# Patient Record
Sex: Male | Born: 1965 | Race: White | Hispanic: No | Marital: Married | Smoking: Never smoker
Health system: Southern US, Community
[De-identification: ages and names within clinical notes are randomized; demographics above are authoritative.]

---

## 2020-11-30 ENCOUNTER — Other Ambulatory Visit: Payer: Self-pay

## 2020-11-30 ENCOUNTER — Emergency Department (HOSPITAL_BASED_OUTPATIENT_CLINIC_OR_DEPARTMENT_OTHER): Payer: No Typology Code available for payment source

## 2020-11-30 ENCOUNTER — Encounter (HOSPITAL_BASED_OUTPATIENT_CLINIC_OR_DEPARTMENT_OTHER): Payer: Self-pay | Admitting: *Deleted

## 2020-11-30 ENCOUNTER — Emergency Department (HOSPITAL_BASED_OUTPATIENT_CLINIC_OR_DEPARTMENT_OTHER)
Admission: EM | Admit: 2020-11-30 | Discharge: 2020-11-30 | Disposition: A | Discharge status: Home / Self Care | Payer: No Typology Code available for payment source | Attending: Emergency Medicine | Admitting: Emergency Medicine

## 2020-11-30 DIAGNOSIS — K5900 Constipation, unspecified: Secondary | ICD-10-CM | POA: Insufficient documentation

## 2020-11-30 DIAGNOSIS — R1013 Epigastric pain: Secondary | ICD-10-CM | POA: Diagnosis present

## 2020-11-30 DIAGNOSIS — R1010 Upper abdominal pain, unspecified: Secondary | ICD-10-CM | POA: Diagnosis not present

## 2020-11-30 DIAGNOSIS — R079 Chest pain, unspecified: Secondary | ICD-10-CM

## 2020-11-30 LAB — BASIC METABOLIC PANEL
Anion gap: 8 (ref 5–15)
BUN: 10 mg/dL (ref 6–20)
CO2: 25 mmol/L (ref 22–32)
Calcium: 8.8 mg/dL — ABNORMAL LOW (ref 8.9–10.3)
Chloride: 105 mmol/L (ref 98–111)
Creatinine, Ser: 0.76 mg/dL (ref 0.61–1.24)
GFR, Estimated: >60 mL/min (ref >60)
Glucose, Bld: 117 mg/dL — ABNORMAL HIGH (ref 70–99)
Potassium: 4 mmol/L (ref 3.5–5.1)
Sodium: 138 mmol/L (ref 135–145)

## 2020-11-30 LAB — CBC
HCT: 47.3 % (ref 39.0–52.0)
Hemoglobin: 15.4 g/dL (ref 13.0–17.0)
MCH: 29.3 pg (ref 26.0–34.0)
MCHC: 32.6 g/dL (ref 30.0–36.0)
MCV: 90.1 fL (ref 80.0–100.0)
Platelets: 218 10*3/uL (ref 150–400)
RBC: 5.25 MIL/uL (ref 4.22–5.81)
RDW: 13.1 % (ref 11.5–15.5)
WBC: 6.1 10*3/uL (ref 4.0–10.5)
nRBC: 0 % (ref 0.0–0.2)

## 2020-11-30 LAB — TROPONIN I (HIGH SENSITIVITY)
Troponin I (High Sensitivity): 2 ng/L (ref <18)
Troponin I (High Sensitivity): <2 ng/L (ref <18)

## 2020-11-30 LAB — LIPASE, BLOOD: Lipase: 30 U/L (ref 11–51)

## 2020-11-30 MED ORDER — ALUM & MAG HYDROXIDE-SIMETH 200-200-20 MG/5ML PO SUSP
30.0000 mL | Freq: Once | ORAL | Status: AC
  Administered 2020-11-30: 30 mL via ORAL
  Filled 2020-11-30: qty 30

## 2020-11-30 MED ORDER — LIDOCAINE VISCOUS HCL 2 % MT SOLN
15.0000 mL | Freq: Once | OROMUCOSAL | Status: AC
  Administered 2020-11-30: 15 mL via ORAL
  Filled 2020-11-30: qty 15

## 2020-11-30 NOTE — Discharge Instructions (Addendum)
Below is a referral to [facility] gastroenterology.  Please give them a call and schedule a follow-up appointment at your convenience.  If you develop any new or worsening symptoms, please come back to the emergency department.  It was a pleasure to meet you.

## 2020-11-30 NOTE — ED Triage Notes (Signed)
Epigastric pain off and on x 6 months. EKG done at triage. He has not taken any OTC meds.

## 2020-11-30 NOTE — ED Provider Notes (Signed)
[facility] EMERGENCY DEPARTMENT Provider Note   CSN: [id number] Arrival date & time: 11/30/20  1236     History Chief Complaint  Patient presents with   Chest Pain    Andres Montes is a 55 y.o. male.  HPI Patient is a 55 year old male who is followed at the [place] who presents to the emergency department due to chest pain.  Patient states that he has a history of IBS-C and was having upper abdominal cramping as well as retching yesterday.  After this occurred he then began experiencing right-sided intermittent aching chest pain.  Worsens with movement.  Will sometimes radiate down his right arm.  No shortness of breath or diaphoresis when this occurs.  No current nausea or vomiting.  He states that he was initially constipated but had a BM yesterday.  No other complaints.  No unilateral leg swelling, hormone use, recent travel, history of blood clots.    History reviewed. No pertinent past medical history.  There are no problems to display for this patient.   History reviewed. No pertinent surgical history.     No family history on file.  Social History   Tobacco Use   Smoking status: Never   Smokeless tobacco: Never  Vaping Use   Vaping Use: Never used  Substance Use Topics   Alcohol use: Never   Drug use: Never    Home Medications Prior to Admission medications   Not on File    Allergies    Penicillins  Review of Systems   Review of Systems  All other systems reviewed and are negative. Ten systems reviewed and are negative for acute change, except as noted in the HPI.   Physical Exam Updated Vital Signs BP 131/76   Pulse 73   Temp 98.7 F (37.1 C) (Oral)   Resp 17   Ht 5\' 10"  (1.778 m)   Wt 127 kg   SpO2 96%   BMI 40.18 kg/m   Physical Exam Vitals and nursing note reviewed.  Constitutional:      General: He is not in acute distress.    Appearance: Normal appearance. He is well-developed. He is not ill-appearing, toxic-appearing or  diaphoretic.  HENT:     Head: Normocephalic and atraumatic.     Right Ear: External ear normal.     Left Ear: External ear normal.     Nose: Nose normal.     Mouth/Throat:     Mouth: Mucous membranes are moist.     Pharynx: Oropharynx is clear. No oropharyngeal exudate or posterior oropharyngeal erythema.  Eyes:     Extraocular Movements: Extraocular movements intact.  Cardiovascular:     Rate and Rhythm: Normal rate and regular rhythm.     Pulses: Normal pulses.          Radial pulses are 2+ on the right side and 2+ on the left side.       Dorsalis pedis pulses are 2+ on the right side and 2+ on the left side.     Heart sounds: Normal heart sounds. Heart sounds not distant. No murmur heard. No systolic murmur is present.  No diastolic murmur is present.    No friction rub. No gallop.  Pulmonary:     Effort: Pulmonary effort is normal. No tachypnea, accessory muscle usage or respiratory distress.     Breath sounds: Normal breath sounds. No stridor. No decreased breath sounds, wheezing, rhonchi or rales.     Comments: Lungs are clear to auscultation bilaterally. Abdominal:  General: Abdomen is flat.     Palpations: Abdomen is soft.     Tenderness: There is abdominal tenderness.     Comments: Protuberant abdomen that is soft.  Mild tenderness noted overlying the epigastrium.  Musculoskeletal:        General: Normal range of motion.     Cervical back: Normal range of motion and neck supple. No tenderness.     Right lower leg: No tenderness. No edema.     Left lower leg: No tenderness. No edema.     Comments: No leg swelling or calf tenderness.  Skin:    General: Skin is warm and dry.  Neurological:     General: No focal deficit present.     Mental Status: He is alert and oriented to person, place, and time.  Psychiatric:        Mood and Affect: Mood normal.        Behavior: Behavior normal.   ED Results / Procedures / Treatments   Labs (all labs ordered are listed, but  only abnormal results are displayed) Labs Reviewed  BASIC METABOLIC PANEL - Abnormal; Notable for the following components:      Result Value   Glucose, Bld 117 (*)    Calcium 8.8 (*)    All other components within normal limits  CBC  LIPASE, BLOOD  TROPONIN I (HIGH SENSITIVITY)  TROPONIN I (HIGH SENSITIVITY)   EKG EKG Interpretation  Date/Time:  Monday November 30 2020 12:42:05 EDT Ventricular Rate:  87 PR Interval:  172 QRS Duration: 82 QT Interval:  350 QTC Calculation: 421 R Axis:   -54 Text Interpretation: Normal sinus rhythm Left anterior fascicular block Cannot rule out Inferior infarct (masked by fascicular block?) , age undetermined Abnormal ECG No previous ECGs available Confirmed by Frederick Peers [phone]) on 11/30/2020 1:05:06 PM  Radiology DG Chest 2 View  Result Date: 11/30/2020 CLINICAL DATA:  Epigastric pain. Additional history provided: Patient reports central chest pain for 3 days, digestive problems. Epigastric pain off and on for 6 months. EXAM: CHEST - 2 VIEW COMPARISON:  No pertinent prior exams available for comparison. FINDINGS: Heart size within normal limits. No appreciable airspace consolidation. No evidence of pleural effusion or pneumothorax. No acute bony abnormality identified. IMPRESSION: No evidence of active cardiopulmonary disease. Electronically Signed   By: Jackey Loge DO   On: 11/30/2020 13:01    Procedures Procedures   Medications Ordered in ED Medications  alum & mag hydroxide-simeth (MAALOX/MYLANTA) 200-200-20 MG/5ML suspension 30 mL (30 mLs Oral Given 11/30/20 1506)    And  lidocaine (XYLOCAINE) 2 % viscous mouth solution 15 mL (15 mLs Oral Given 11/30/20 1506)   ED Course  I have reviewed the triage vital signs and the nursing notes.  Pertinent labs & imaging results that were available during my care of the patient were reviewed by me and considered in my medical decision making (see chart for details).  Clinical Course as of 11/30/20  1615  Mon Nov 30, 2020  1439 Initial lab work reassuring.  No elevation in troponin.  Will repeat troponin.  Will additionally add lipase.  GI cocktail.  Will reassess. [LJ]    Clinical Course User Index [LJ] Placido Sou, PA-C   MDM Rules/Calculators/A&P                          Pt is a 55 y.o. male who presents to the ED with aching CP and epigastric pain.  Labs: CBC without abnormalities. BMP with a glucose of 117 and a calcium of 8.8. Lipase of 30. Troponin of 2 with a repeat less than 2.  Imaging: Chest x-ray is negative.  I, Placido Sou, PA-C, personally reviewed and evaluated these images and lab results as part of my medical decision-making.  Lab work today is reassuring.  No elevation in troponins.  Doubt ACS.  Chest x-ray is negative.  Given a GI cocktail with minimal relief.  Discussed findings today with patient as well as his wife who is a Designer, jewellery.  Feel that he is stable for discharge at this time and they are agreeable.  She requested a new referral to gastroenterology which was given to them.  They verbalized understanding of the above plan.  Given strict return precautions.  Their questions were answered and they were amicable at the time of discharge.  Note: Portions of this report may have been transcribed using voice recognition software. Every effort was made to ensure accuracy; however, inadvertent computerized transcription errors may be present.   Final Clinical Impression(s) / ED Diagnoses Final diagnoses:  Epigastric pain  Chest pain, unspecified type    Rx / DC Orders ED Discharge Orders     None        Placido Sou, PA-C 11/30/20 1617    Tilden Fossa, MD 11/30/20 [phone]

## 2022-06-24 IMAGING — CR DG CHEST 2V
2 series · 2 of 2 positions shown · non-contrast
Comparison: No pertinent prior exams available for comparison.

CLINICAL DATA: Epigastric pain. Additional history provided:
Epigastric pain off and on for 6 months.

EXAM:
CHEST - 2 VIEW

[w chest pa]
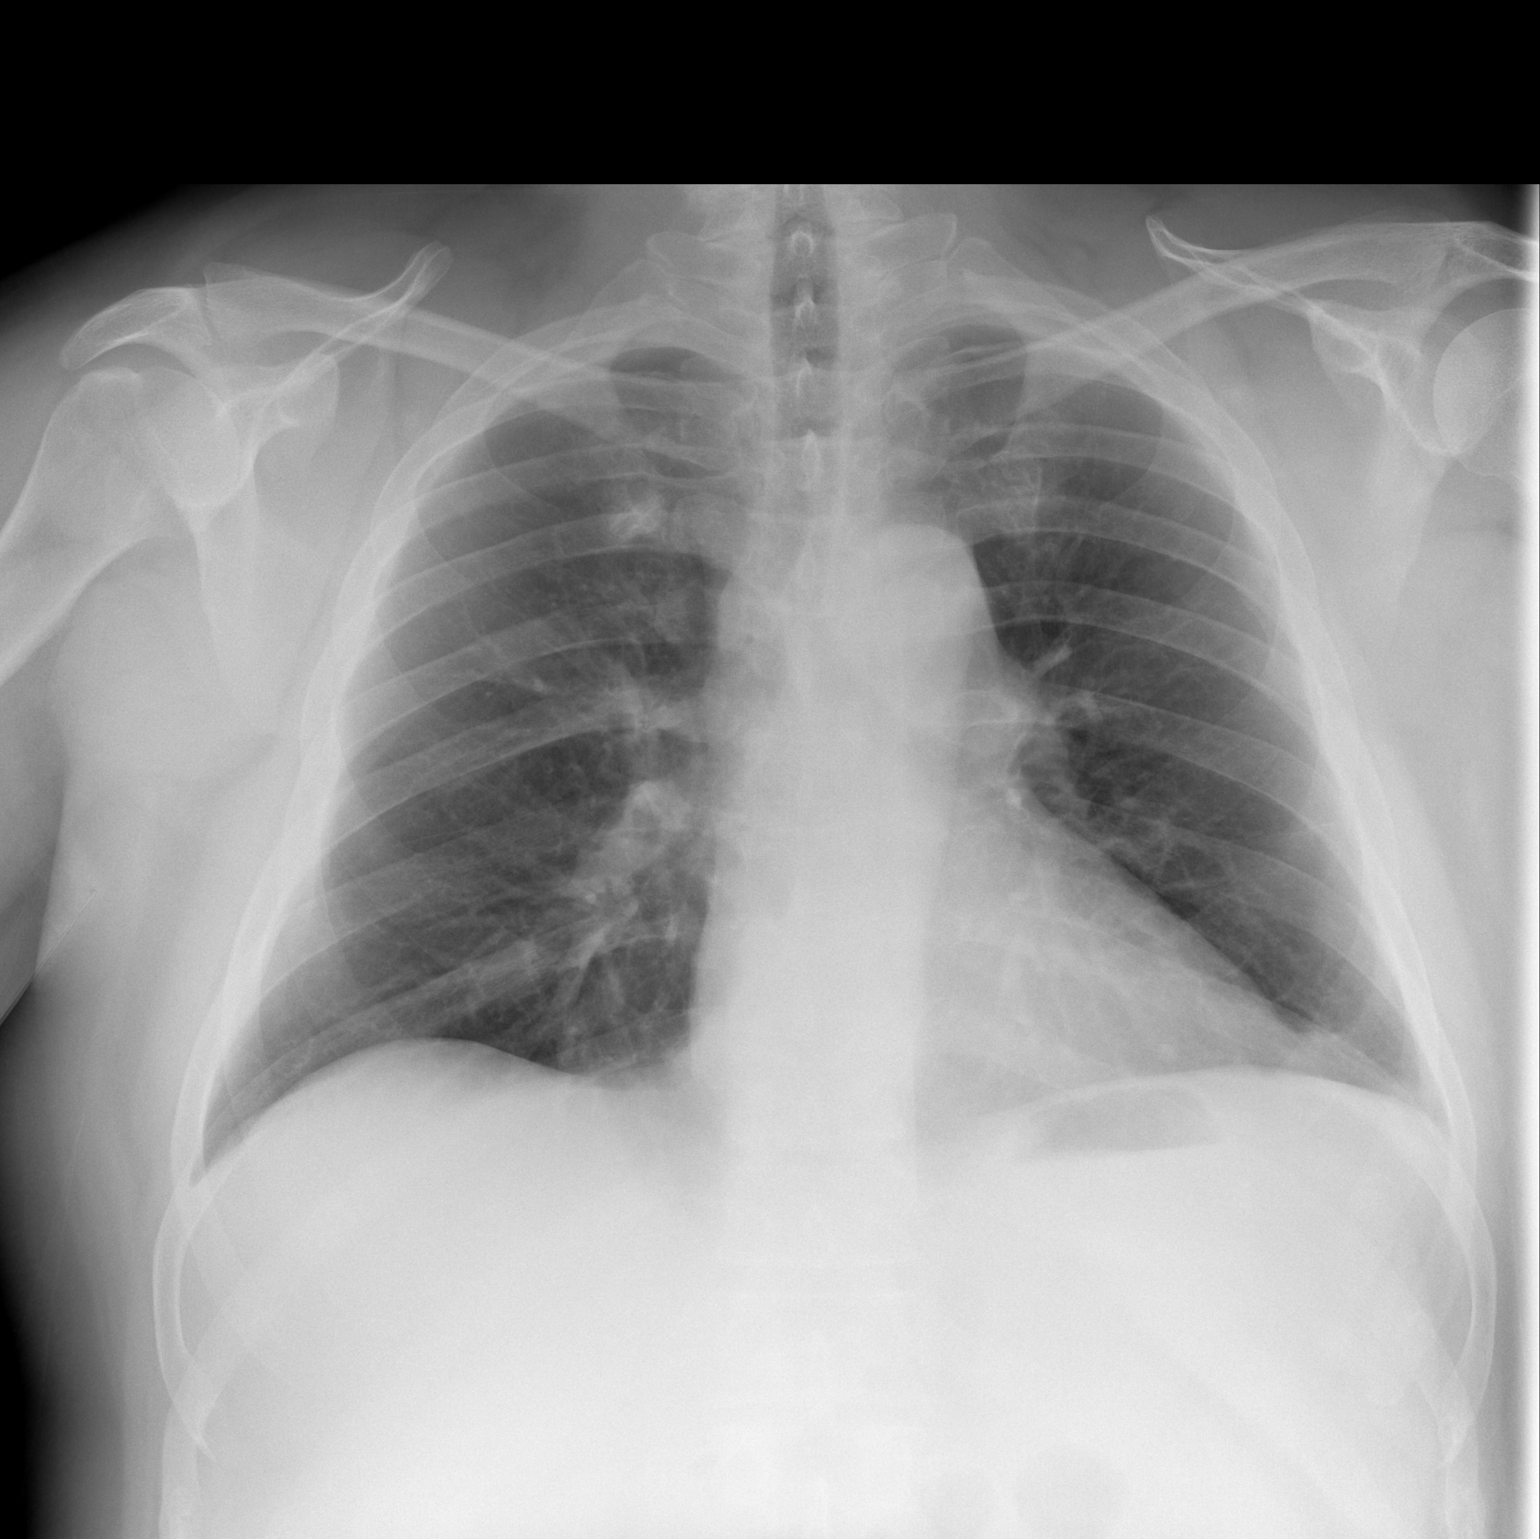

[w chest lat]
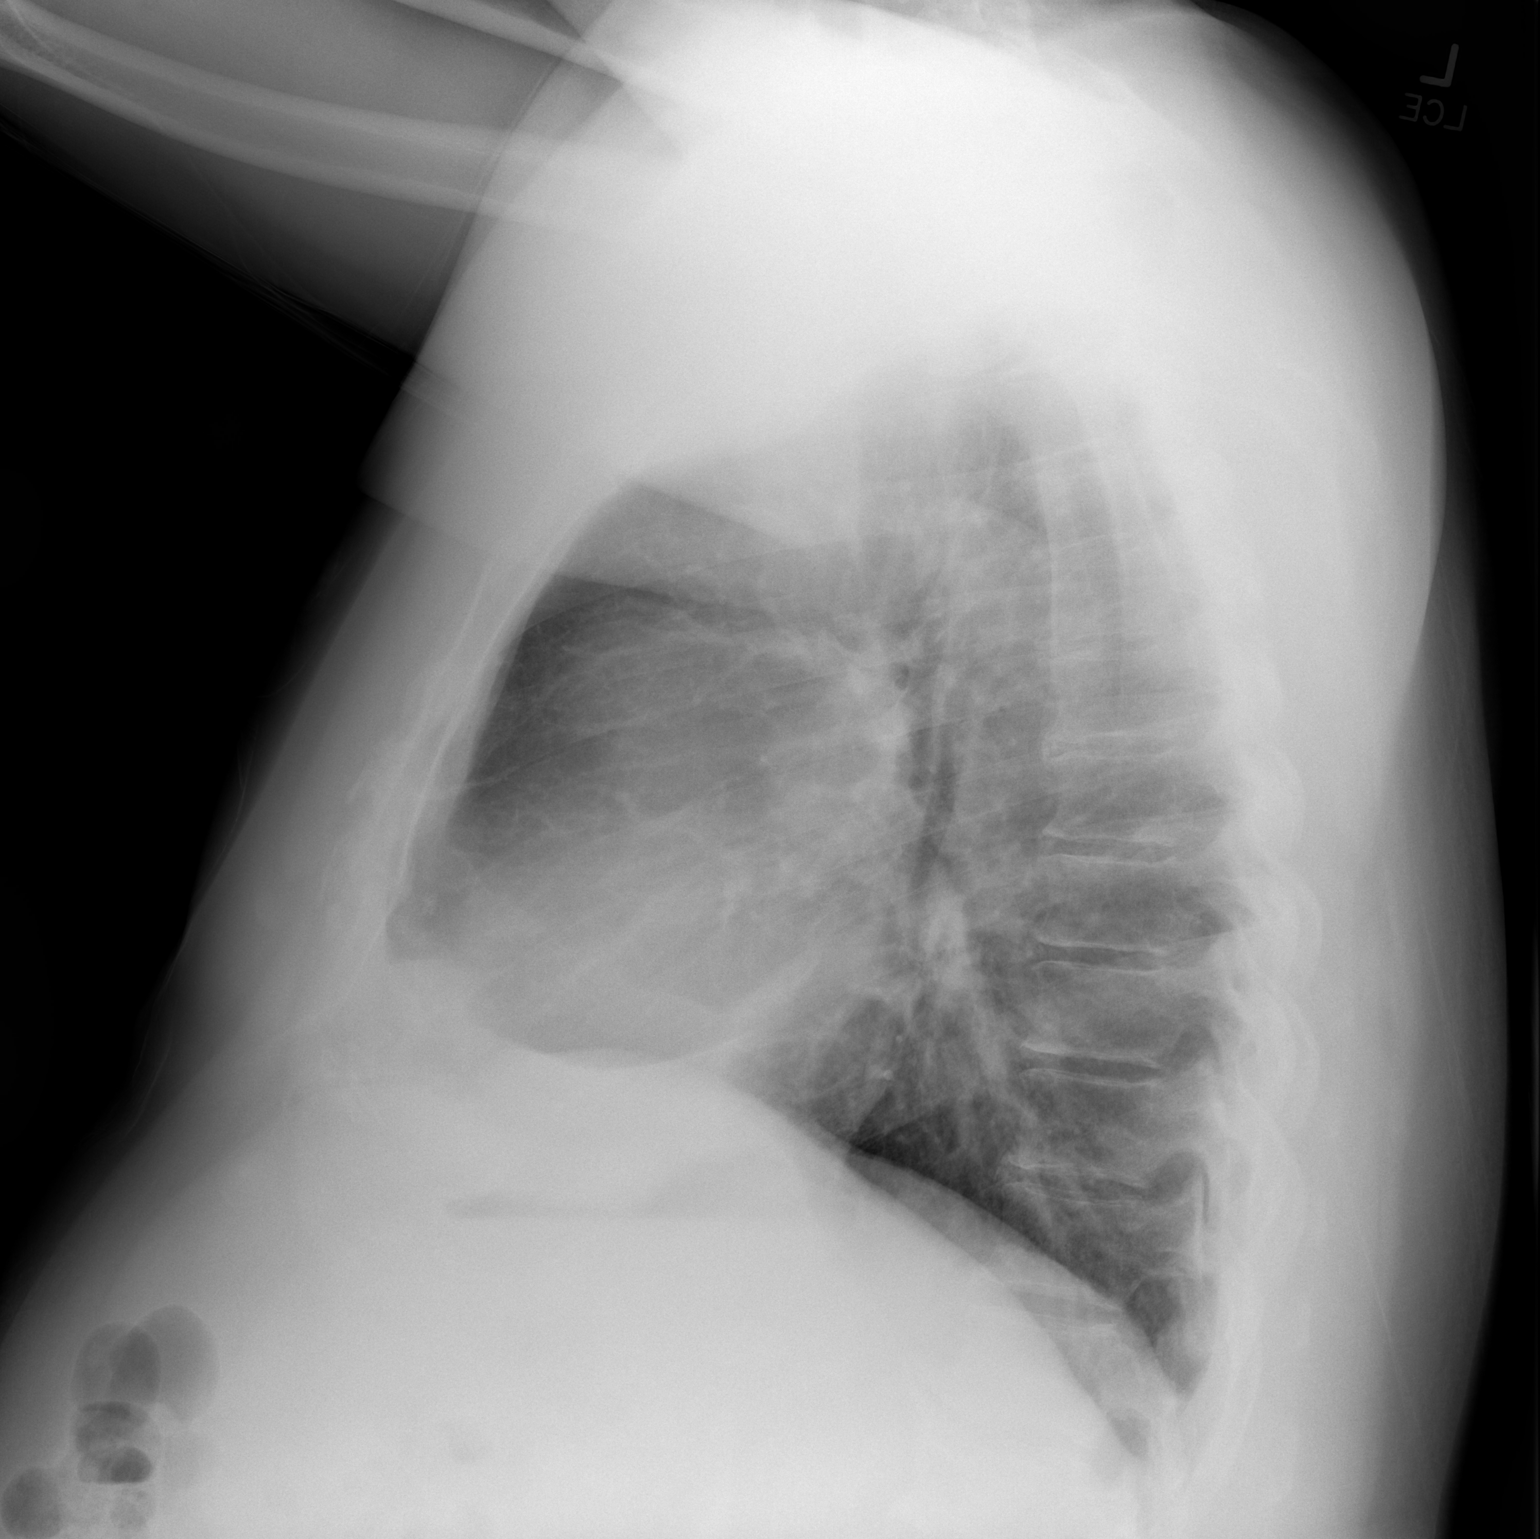

[2 of 2 positions shown; findings below may reference images not displayed]

FINDINGS: Heart size within normal limits. No appreciable airspace
consolidation. No evidence of pleural effusion or pneumothorax. No
acute bony abnormality identified.
IMPRESSION: No evidence of active cardiopulmonary disease.
